# Patient Record
Sex: Male | Born: 1985 | Race: White | Hispanic: No | Marital: Married | Smoking: Never smoker
Health system: Southern US, Community
[De-identification: ages and names within clinical notes are randomized; demographics above are authoritative.]

## PROBLEM LIST (undated history)

## (undated) DIAGNOSIS — R112 Nausea with vomiting, unspecified: Secondary | ICD-10-CM

## (undated) DIAGNOSIS — T4145XA Adverse effect of unspecified anesthetic, initial encounter: Secondary | ICD-10-CM

## (undated) DIAGNOSIS — T8859XA Other complications of anesthesia, initial encounter: Secondary | ICD-10-CM

## (undated) DIAGNOSIS — Z9889 Other specified postprocedural states: Secondary | ICD-10-CM

## (undated) DIAGNOSIS — D649 Anemia, unspecified: Secondary | ICD-10-CM

## (undated) HISTORY — PX: TONSILLECTOMY: SUR1361

---

## 2005-10-03 ENCOUNTER — Emergency Department: Payer: Self-pay | Admitting: Unknown Physician Specialty

## 2006-10-12 ENCOUNTER — Emergency Department: Payer: Self-pay | Admitting: Internal Medicine

## 2006-10-17 ENCOUNTER — Emergency Department: Payer: Self-pay | Admitting: Emergency Medicine

## 2006-11-14 ENCOUNTER — Ambulatory Visit: Payer: Self-pay | Admitting: Family Medicine

## 2007-08-18 IMAGING — CR DG CLAVICLE*L*
1 series · 4 of 4 positions shown · non-contrast
Comparison: none

REASON FOR EXAM: Injury
COMMENTS:  LMP: (Male)

[Series 1: view not recorded · 0.17mm/px · 4 of 4 slices shown]
[im 1/4]
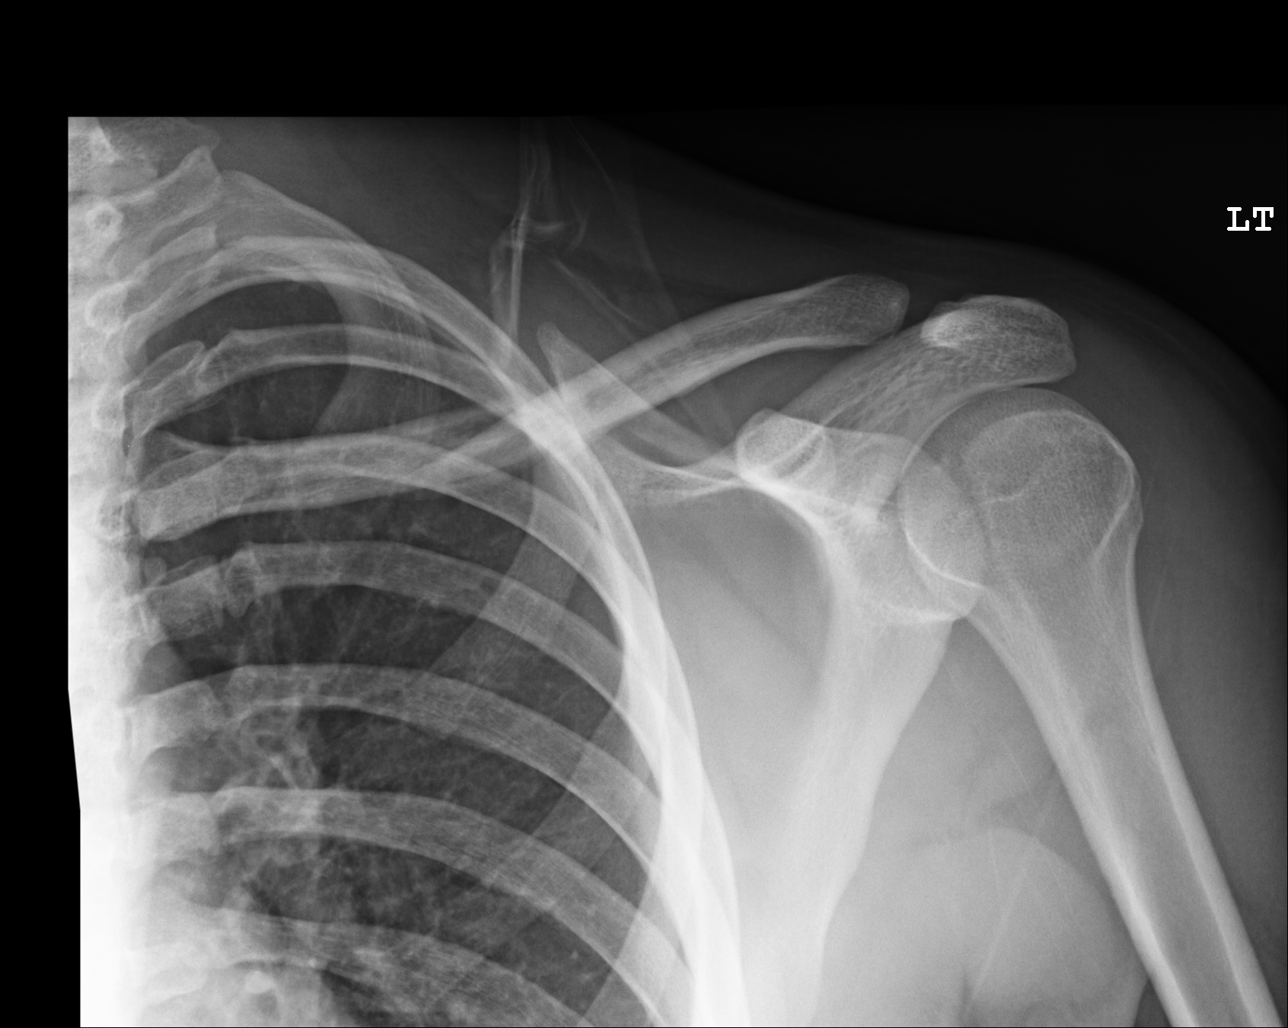
[im 2/4]
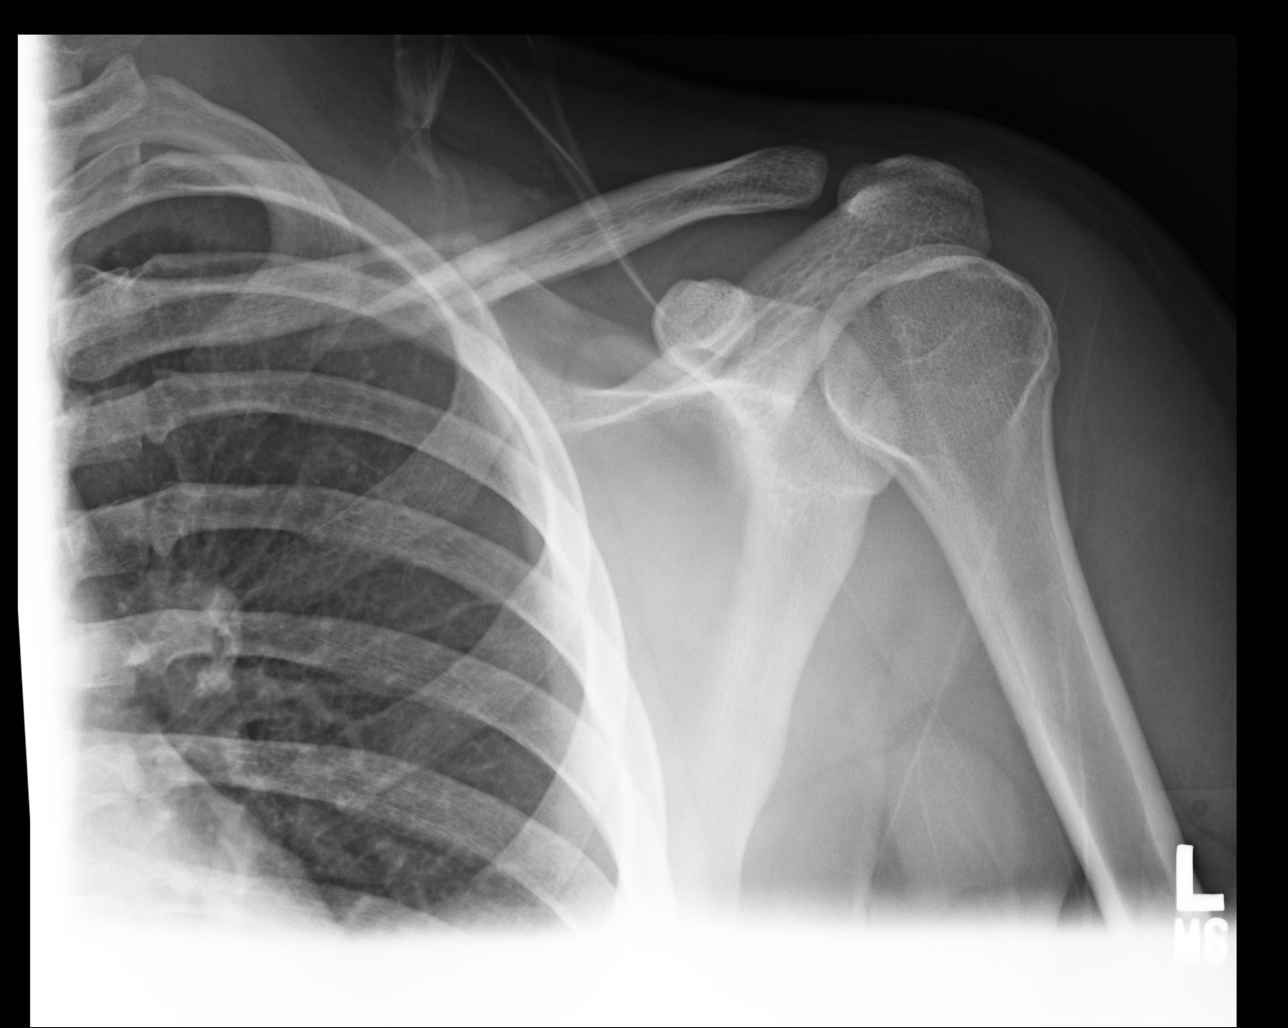
[im 3/4]
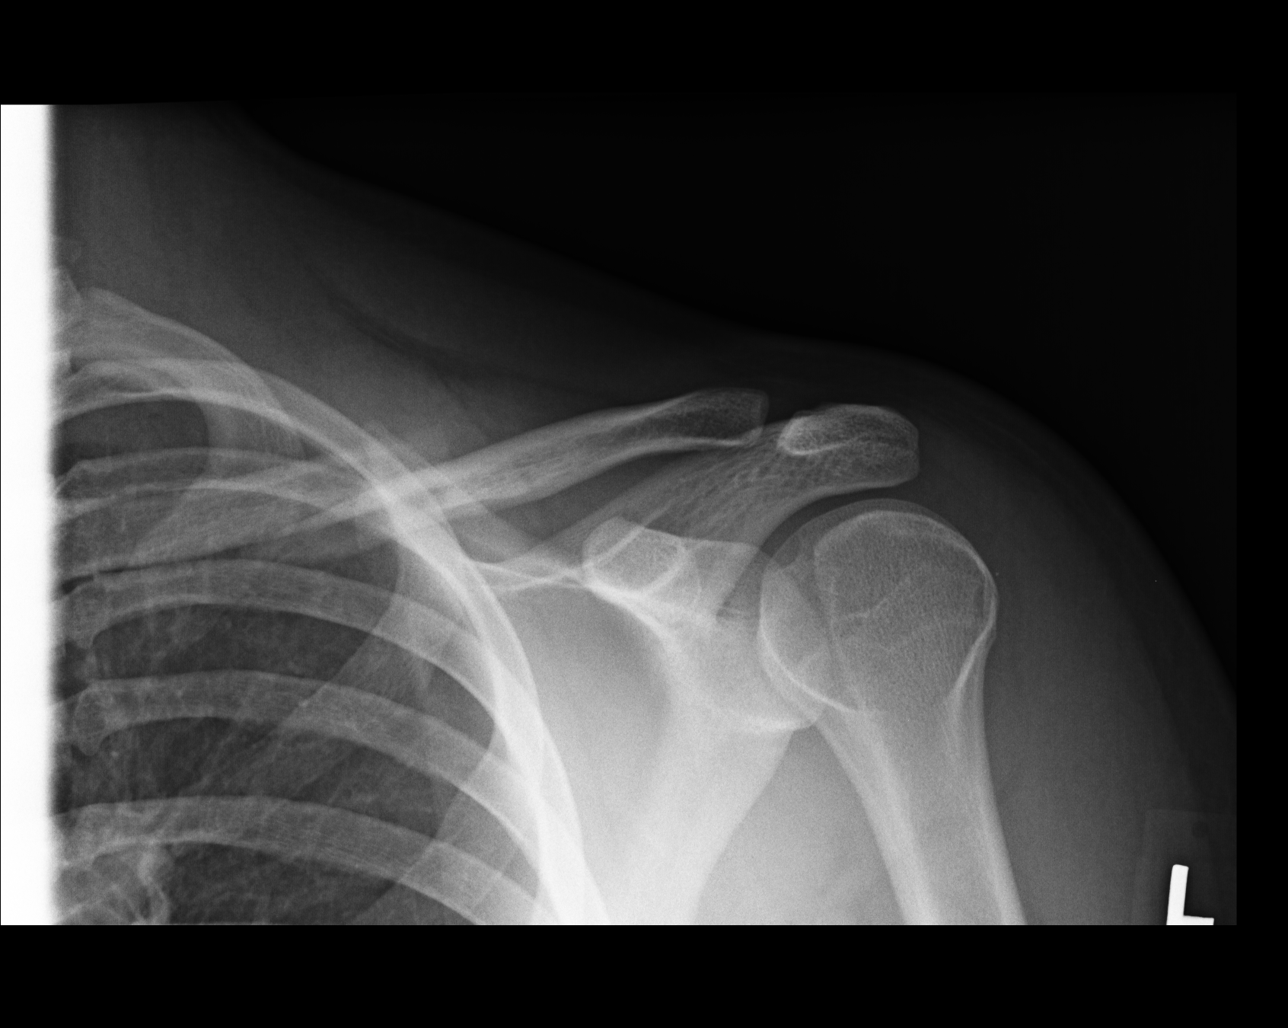
[im 4/4]
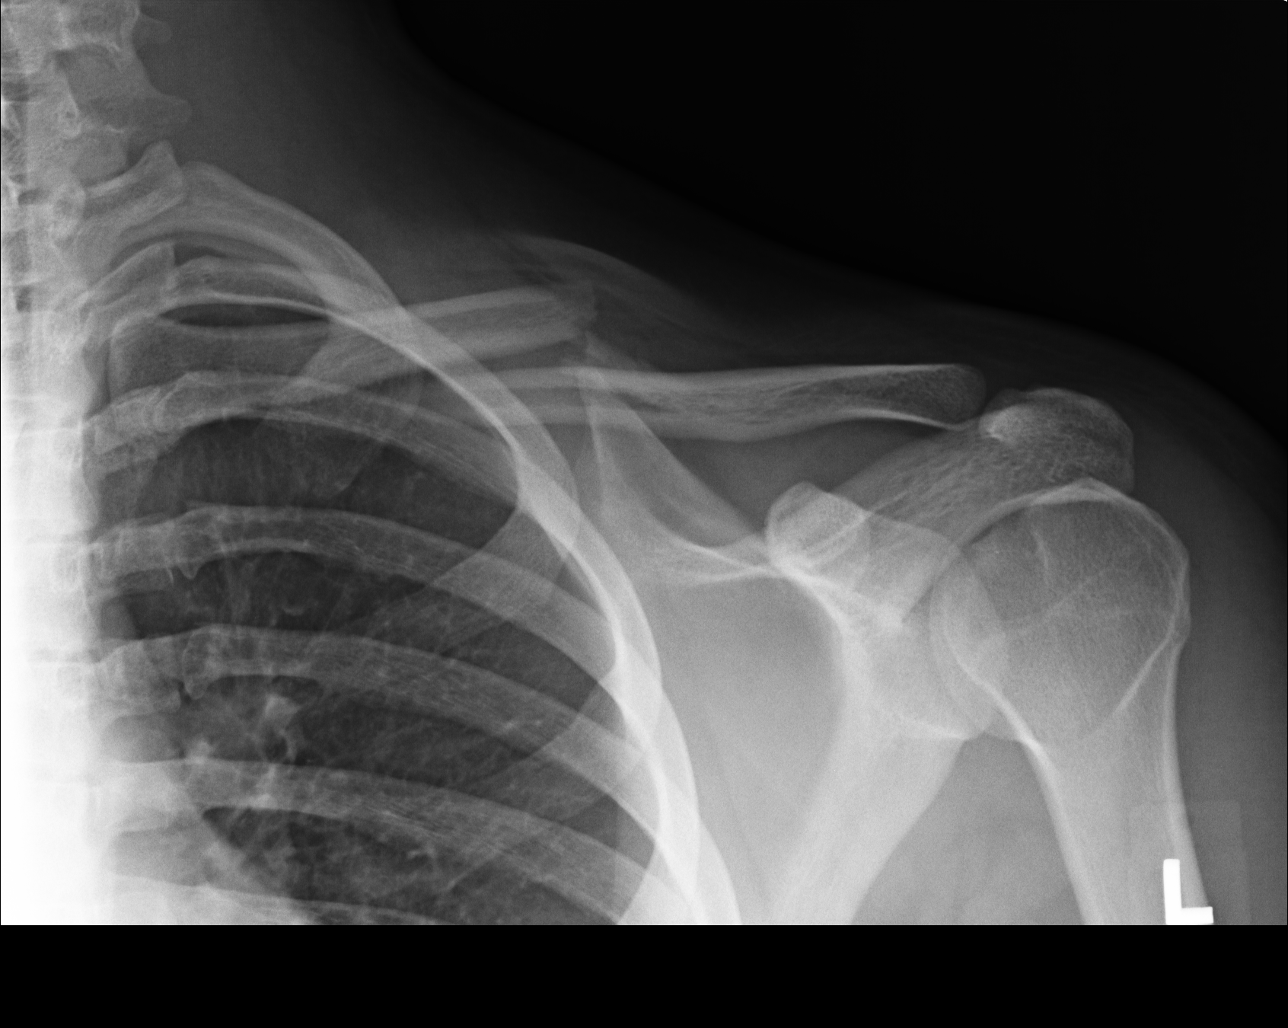

[4 of 4 positions shown; findings below may reference images not displayed]

PROCEDURE:     DXR - DXR CLAVICLE LEFT  - October 03, 2005  [DATE]

RESULT:     Four views of the LEFT clavicle were obtained. There is a
fracture of the midshaft of the clavicle with the lateral fracture component
displaced inferiorly by approximately 1.0 cm. No other fractures are seen.
No acromioclavicular separation is observed.
IMPRESSION: Fracture of the LEFT clavicle.
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
## 2007-11-13 ENCOUNTER — Ambulatory Visit: Payer: Self-pay | Admitting: Internal Medicine

## 2009-07-05 ENCOUNTER — Ambulatory Visit: Payer: Self-pay | Admitting: Family Medicine

## 2009-07-05 DIAGNOSIS — I10 Essential (primary) hypertension: Secondary | ICD-10-CM | POA: Insufficient documentation

## 2010-02-27 NOTE — Assessment & Plan Note (Signed)
Summary: TO BE EST/NJR   Vital Signs:  Patient profile:   25 year old male Height:      72.5 inches Weight:      225 pounds BMI:     30.20 Temp:     98.0 degrees F oral Pulse rate:   72 / minute Pulse rhythm:   regular Resp:     12 per minute BP sitting:   130 / 88  (left arm) Cuff size:   regular  Vitals Entered By: Sid Falcon LPN (July 06, 5782 9:08 AM)  Nutrition Counseling: Patient's BMI is greater than 25 and therefore counseled on weight management options.  CC: New to establish   History of Present Illness: Recent elev BP at work around 150s/90s  Pt saw company nurse and counseled with weight loss, exercise and diet change. has lost about 10 pounds past month with exercise and diet. BPs have improved. No ETOH use.  Nonsmoker  Preventive Screening-Counseling & Management  Alcohol-Tobacco     Smoking Status: never  Caffeine-Diet-Exercise     Does Patient Exercise: yes  Allergies (verified): 1)  Sulfa  Past History:  Family History: Last updated: 07/05/2009 Father elevated cholesterol Paternal grandfather, elevated cholesterol, hypertension Maternal grandfather, sudden death age 54, heart disease  Social History: Last updated: 07/05/2009 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 Never Smoked Alcohol use-no Regular exercise-yes  Risk Factors: Exercise: yes (07/05/2009)  Risk Factors: Smoking Status: never (07/05/2009)  Past Medical History: Hypertension  Past Surgical History:  Tonsillectomy, 1999  Family History: Father elevated cholesterol Paternal grandfather, elevated cholesterol, hypertension Maternal grandfather, sudden death age 63, heart disease  Social History: 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 Never Smoked Alcohol use-no Regular exercise-yes Occupation:  employed Smoking Status:  never Does Patient Exercise:  yes  Review of Systems  The patient denies anorexia, fever, weight loss, weight gain, vision loss,  decreased hearing, hoarseness, chest pain, syncope, dyspnea on exertion, peripheral edema, prolonged cough, headaches, hemoptysis, abdominal pain, melena, hematochezia, severe indigestion/heartburn, hematuria, incontinence, genital sores, muscle weakness, suspicious skin lesions, transient blindness, difficulty walking, depression, unusual weight change, abnormal bleeding, enlarged lymph nodes, and testicular masses.    Physical Exam  General:  Well-developed,well-nourished,in no acute distress; alert,appropriate and cooperative throughout examination Head:  Normocephalic and atraumatic without obvious abnormalities. No apparent alopecia or balding. Eyes:  pupils equal, pupils round, and pupils reactive to light.   Mouth:  Oral mucosa and oropharynx without lesions or exudates.  Teeth in good repair. Neck:  No deformities, masses, or tenderness noted. Lungs:  Normal respiratory effort, chest expands symmetrically. Lungs are clear to auscultation, no crackles or wheezes. Heart:  Normal rate and regular rhythm. S1 and S2 normal without gallop, murmur, click, rub or other extra sounds. Abdomen:  Bowel sounds positive,abdomen soft and non-tender without masses, organomegaly or hernias noted. Extremities:  no edema.   Impression & Recommendations:  Problem # 1:  HYPERTENSION (ICD-401.9) Assessment Improved cont healthy lifestyle behavior and close monitoring.  Complete Medication List: 1)  Nasonex 50 Mcg/act Susp (Mometasone furoate) .... Two sprays once daily each nostril  Patient Instructions: 1)  It is important that you exercise reguarly at least 20 minutes 5 times a week. If you develop chest pain, have severe difficulty breathing, or feel very tired, stop exercising immediately and seek medical attention.  2)  You need to lose weight. Consider a lower calorie diet and regular exercise.  3)  Check your  Blood Pressure regularly . If  it is above: 140/90  you should make an  appointment.  Preventive Care Screening  Last Tetanus Booster:    Date:  10/12/2006    Results:  Historical

## 2011-01-29 HISTORY — PX: COLONOSCOPY WITH ESOPHAGOGASTRODUODENOSCOPY (EGD): SHX5779

## 2011-02-12 ENCOUNTER — Ambulatory Visit: Payer: Self-pay | Admitting: Internal Medicine

## 2011-02-22 ENCOUNTER — Ambulatory Visit: Payer: Self-pay | Admitting: Internal Medicine

## 2011-02-26 ENCOUNTER — Ambulatory Visit: Payer: Self-pay | Admitting: Internal Medicine

## 2011-02-26 LAB — URINALYSIS, COMPLETE
Bacteria: NEGATIVE
Bilirubin,UR: NEGATIVE
Leukocyte Esterase: NEGATIVE
Nitrite: NEGATIVE
Ph: 7 (ref 4.5–8.0)
RBC,UR: NONE SEEN /HPF (ref 0–5)

## 2011-02-26 LAB — IRON: Iron: 22 ug/dL — ABNORMAL LOW (ref 65–175)

## 2011-03-01 ENCOUNTER — Ambulatory Visit: Payer: Self-pay | Admitting: Internal Medicine

## 2012-12-27 IMAGING — NM NUCLEAR MEDICINE MECKELS SCAN
2 series · 13 of 13 positions shown · non-contrast
Comparison: none

REASON FOR EXAM: iron def anemia
COMMENTS:

PROCEDURE:     NM  - NM NOBUTAKE LOCALIZATION SCAN  - [DATE] [DATE] [DATE]  [DATE]
RESULT:     Comparison: None.
Radiopharmaceutical: 18.35 mCi technetium 99m pertechnetate
TECHNIQUE: Standard departmental Jiky scan was performed, with
monitoring via anterior planar images for 60 minutes.

[Series 1000: (person_name) statics · 2.40mm/px · 7 of 7 slices shown]
[im 1/7]
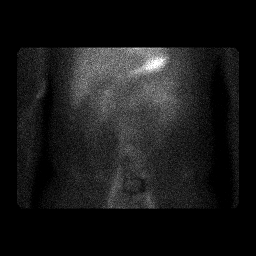
[im 2/7]
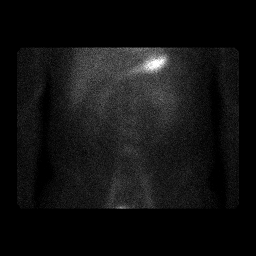
[im 3/7]
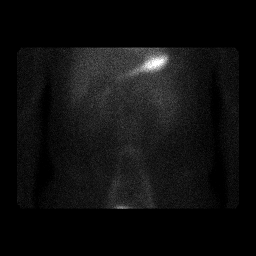
[im 4/7]
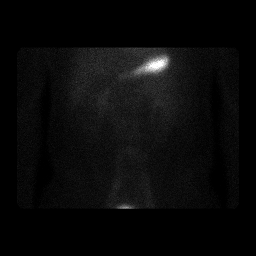
[im 5/7]
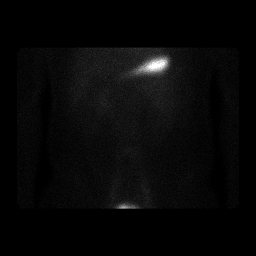
[im 6/7]
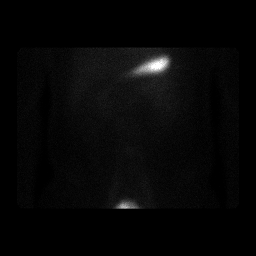
[im 7/7]
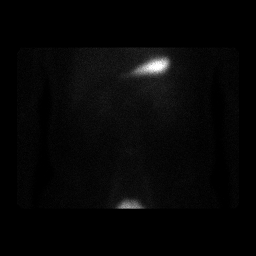

[Series 1000: (person_name) dynamic · 4.80mm/px · 6 of 12 frames shown]
[frame 2/12]
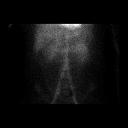
[frame 4/12]
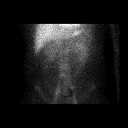
[frame 6/12]
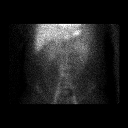
[frame 8/12]
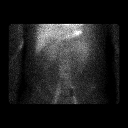
[frame 10/12]
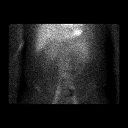
[frame 12/12]
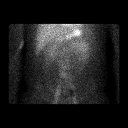

[13 of 13 positions shown; findings below may reference images not displayed]

FINDINGS: There is normal radiotracer distribution. Normal radiotracer activity is
seen within the stomach. There are no abnormal foci of radiotracer in the
mid to lower abdomen to suggest a Meckel's diverticulum.
IMPRESSION: No findings to suggest Meckel's diverticulum. Please note, some Meckel's
diverticulum did not contain ectopic gastric mucosa and would be occult on Fils-Aime
Jiky scan.

## 2015-05-29 ENCOUNTER — Encounter: Payer: Self-pay | Admitting: *Deleted

## 2015-05-29 ENCOUNTER — Other Ambulatory Visit: Payer: Self-pay

## 2015-05-29 HISTORY — PX: VASECTOMY: SHX75

## 2015-05-29 NOTE — Patient Instructions (Signed)
  Your procedure is scheduled on: 06-01-15 Report to [facility] 2ND FLOOR To find out your arrival time please call [phone] between 1PM - 3PM on 05-31-15  Remember: Instructions that are not followed completely may result in serious medical risk, up to and including death, or upon the discretion of your surgeon and anesthesiologist your surgery may need to be rescheduled.    _X___ 1. Do not eat food or drink liquids after midnight. No gum chewing or hard candies.     _X___ 2. No Alcohol for 24 hours before or after surgery.   ____ 3. Bring all medications with you on the day of surgery if instructed.    ____ 4. Notify your doctor if there is any change in your medical condition     (cold, fever, infections).     Do not wear jewelry, make-up, hairpins, clips or nail polish.  Do not wear lotions, powders, or perfumes. You may wear deodorant.  Do not shave 48 hours prior to surgery. Men may shave face and neck.  Do not bring valuables to the hospital.    [facility] is not responsible for any belongings or valuables.               Contacts, dentures or bridgework may not be worn into surgery.  Leave your suitcase in the car. After surgery it may be brought to your room.  For patients admitted to the hospital, discharge time is determined by your treatment team.   Patients discharged the day of surgery will not be allowed to drive home.   Please read over the following fact sheets that you were given:      ____ Take these medicines the morning of surgery with A SIP OF WATER:    1.NONE  2.   3.   4.  5.  6.  ____ Fleet Enema (as directed)   ____ Use CHG Soap as directed  ____ Use inhalers on the day of surgery  ____ Stop metformin 2 days prior to surgery    ____ Take 1/2 of usual insulin dose the night before surgery and none on the morning of surgery.   ____ Stop Coumadin/Plavix/aspirin-N/A  _X___ Stop Anti-inflammatories-STOP IBUPROFEN AFTER  TODAY-NO NSAIDS OR ASA PRODUCTS-TYLENOL OK TO TAKE   ____ Stop supplements until after surgery.    ____ Bring C-Pap to the hospital.

## 2015-05-31 ENCOUNTER — Encounter: Payer: Self-pay | Admitting: *Deleted

## 2015-06-01 ENCOUNTER — Ambulatory Visit
Admission: RE | Admit: 2015-06-01 | Discharge: 2015-06-01 | Disposition: A | Discharge status: Home / Self Care | Payer: BLUE CROSS/BLUE SHIELD | Source: Ambulatory Visit | Attending: Surgery | Admitting: Surgery

## 2015-06-01 ENCOUNTER — Ambulatory Visit: Payer: BLUE CROSS/BLUE SHIELD | Admitting: Certified Registered Nurse Anesthetist

## 2015-06-01 ENCOUNTER — Encounter: Payer: Self-pay | Admitting: *Deleted

## 2015-06-01 ENCOUNTER — Encounter
Admission: RE | Disposition: A | Discharge status: Home / Self Care | Payer: Self-pay | Source: Ambulatory Visit | Attending: Surgery

## 2015-06-01 DIAGNOSIS — Z9852 Vasectomy status: Secondary | ICD-10-CM | POA: Diagnosis not present

## 2015-06-01 DIAGNOSIS — Z882 Allergy status to sulfonamides status: Secondary | ICD-10-CM | POA: Diagnosis not present

## 2015-06-01 DIAGNOSIS — Z888 Allergy status to other drugs, medicaments and biological substances status: Secondary | ICD-10-CM | POA: Insufficient documentation

## 2015-06-01 DIAGNOSIS — Z87892 Personal history of anaphylaxis: Secondary | ICD-10-CM | POA: Diagnosis not present

## 2015-06-01 DIAGNOSIS — Y9367 Activity, basketball: Secondary | ICD-10-CM | POA: Insufficient documentation

## 2015-06-01 DIAGNOSIS — X58XXXA Exposure to other specified factors, initial encounter: Secondary | ICD-10-CM | POA: Insufficient documentation

## 2015-06-01 DIAGNOSIS — Z9889 Other specified postprocedural states: Secondary | ICD-10-CM | POA: Insufficient documentation

## 2015-06-01 DIAGNOSIS — S86011A Strain of right Achilles tendon, initial encounter: Secondary | ICD-10-CM | POA: Diagnosis present

## 2015-06-01 DIAGNOSIS — Z8349 Family history of other endocrine, nutritional and metabolic diseases: Secondary | ICD-10-CM | POA: Diagnosis not present

## 2015-06-01 HISTORY — DX: Adverse effect of unspecified anesthetic, initial encounter: T41.45XA

## 2015-06-01 HISTORY — DX: Other specified postprocedural states: R11.2

## 2015-06-01 HISTORY — DX: Other complications of anesthesia, initial encounter: T88.59XA

## 2015-06-01 HISTORY — DX: Anemia, unspecified: D64.9

## 2015-06-01 HISTORY — DX: Other specified postprocedural states: Z98.890

## 2015-06-01 HISTORY — PX: ACHILLES TENDON SURGERY: SHX542

## 2015-06-01 SURGERY — REPAIR, TENDON, ACHILLES
Anesthesia: General | Laterality: Right

## 2015-06-01 MED ORDER — MIDAZOLAM HCL 2 MG/2ML IJ SOLN
INTRAMUSCULAR | Status: DC | PRN
  Administered 2015-06-01: 2 mg via INTRAVENOUS

## 2015-06-01 MED ORDER — KETOROLAC TROMETHAMINE 30 MG/ML IJ SOLN
INTRAMUSCULAR | Status: DC | PRN
  Administered 2015-06-01: 30 mg via INTRAVENOUS

## 2015-06-01 MED ORDER — ONDANSETRON HCL 4 MG/2ML IJ SOLN: 4.0000 mg | Freq: Four times a day (QID) | INTRAMUSCULAR | Status: DC | PRN

## 2015-06-01 MED ORDER — NEOSTIGMINE METHYLSULFATE 10 MG/10ML IV SOLN
INTRAVENOUS | Status: DC | PRN
  Administered 2015-06-01: 4 mg via INTRAVENOUS

## 2015-06-01 MED ORDER — HYDROCODONE-ACETAMINOPHEN 5-325 MG PO TABS: 1.0000 | ORAL_TABLET | Freq: Four times a day (QID) | ORAL | Status: DC | PRN

## 2015-06-01 MED ORDER — LIDOCAINE HCL (CARDIAC) 20 MG/ML IV SOLN
INTRAVENOUS | Status: DC | PRN
  Administered 2015-06-01: 100 mg via INTRAVENOUS

## 2015-06-01 MED ORDER — FAMOTIDINE 20 MG PO TABS
20.0000 mg | ORAL_TABLET | Freq: Once | ORAL | Status: AC
  Administered 2015-06-01: 20 mg via ORAL

## 2015-06-01 MED ORDER — CEFAZOLIN SODIUM-DEXTROSE 2-4 GM/100ML-% IV SOLN
2.0000 g | Freq: Once | INTRAVENOUS | Status: AC
  Administered 2015-06-01: 2 g via INTRAVENOUS

## 2015-06-01 MED ORDER — METOCLOPRAMIDE HCL 10 MG PO TABS: 5.0000 mg | ORAL_TABLET | Freq: Three times a day (TID) | ORAL | Status: DC | PRN

## 2015-06-01 MED ORDER — NEOMYCIN-POLYMYXIN B GU 40-200000 IR SOLN
Status: DC | PRN
  Administered 2015-06-01: 4 mL

## 2015-06-01 MED ORDER — ONDANSETRON HCL 4 MG PO TABS: 4.0000 mg | ORAL_TABLET | Freq: Four times a day (QID) | ORAL | Status: DC | PRN

## 2015-06-01 MED ORDER — NALOXONE HCL 0.4 MG/ML IJ SOLN
INTRAMUSCULAR | Status: DC | PRN
  Administered 2015-06-01 (×2): 80 ug via INTRAVENOUS

## 2015-06-01 MED ORDER — CEFAZOLIN SODIUM-DEXTROSE 2-4 GM/100ML-% IV SOLN
INTRAVENOUS | Status: AC
  Filled 2015-06-01: qty 100

## 2015-06-01 MED ORDER — BUPIVACAINE HCL (PF) 0.5 % IJ SOLN
INTRAMUSCULAR | Status: AC
  Filled 2015-06-01: qty 30

## 2015-06-01 MED ORDER — FENTANYL CITRATE (PF) 100 MCG/2ML IJ SOLN
INTRAMUSCULAR | Status: DC | PRN
  Administered 2015-06-01 (×2): 250 ug via INTRAVENOUS

## 2015-06-01 MED ORDER — NEOMYCIN-POLYMYXIN B GU 40-200000 IR SOLN
Status: AC
Start: 2015-06-01 — End: 2015-06-01
  Filled 2015-06-01: qty 4

## 2015-06-01 MED ORDER — BUPIVACAINE HCL 0.5 % IJ SOLN
INTRAMUSCULAR | Status: DC | PRN
  Administered 2015-06-01: 10 mL

## 2015-06-01 MED ORDER — ROCURONIUM BROMIDE 100 MG/10ML IV SOLN
INTRAVENOUS | Status: DC | PRN
  Administered 2015-06-01: 10 mg via INTRAVENOUS
  Administered 2015-06-01: 50 mg via INTRAVENOUS

## 2015-06-01 MED ORDER — KETAMINE HCL 50 MG/ML IJ SOLN
INTRAMUSCULAR | Status: DC | PRN
  Administered 2015-06-01: 50 mg via INTRAVENOUS

## 2015-06-01 MED ORDER — GLYCOPYRROLATE 0.2 MG/ML IJ SOLN
INTRAMUSCULAR | Status: DC | PRN
  Administered 2015-06-01: .5 mg via INTRAVENOUS

## 2015-06-01 MED ORDER — ONDANSETRON HCL 4 MG/2ML IJ SOLN: 4.0000 mg | Freq: Once | INTRAMUSCULAR | Status: DC | PRN

## 2015-06-01 MED ORDER — FENTANYL CITRATE (PF) 100 MCG/2ML IJ SOLN: 25.0000 ug | INTRAMUSCULAR | Status: DC | PRN

## 2015-06-01 MED ORDER — PROPOFOL 10 MG/ML IV BOLUS
INTRAVENOUS | Status: DC | PRN
  Administered 2015-06-01: 200 mg via INTRAVENOUS

## 2015-06-01 MED ORDER — POTASSIUM CHLORIDE IN NACL 20-0.9 MEQ/L-% IV SOLN: INTRAVENOUS | Status: DC

## 2015-06-01 MED ORDER — LACTATED RINGERS IV SOLN
INTRAVENOUS | Status: DC
  Administered 2015-06-01: 50 mL/h via INTRAVENOUS

## 2015-06-01 MED ORDER — METOCLOPRAMIDE HCL 5 MG/ML IJ SOLN: 5.0000 mg | Freq: Three times a day (TID) | INTRAMUSCULAR | Status: DC | PRN

## 2015-06-01 MED ORDER — FAMOTIDINE 20 MG PO TABS
ORAL_TABLET | ORAL | Status: AC
  Administered 2015-06-01: 20 mg via ORAL
  Filled 2015-06-01: qty 1

## 2015-06-01 MED ORDER — ONDANSETRON HCL 4 MG/2ML IJ SOLN
INTRAMUSCULAR | Status: DC | PRN
  Administered 2015-06-01: 4 mg via INTRAVENOUS

## 2015-06-01 MED ORDER — HYDROCODONE-ACETAMINOPHEN 5-325 MG PO TABS: 1.0000 | ORAL_TABLET | ORAL | Status: DC | PRN

## 2015-06-01 SURGICAL SUPPLY — 38 items
BANDAGE ACE 4X5 VEL STRL LF (GAUZE/BANDAGES/DRESSINGS) ×6 IMPLANT
BNDG ESMARK 4X12 TAN STRL LF (GAUZE/BANDAGES/DRESSINGS) ×3 IMPLANT
CANISTER SUCT 1200ML W/VALVE (MISCELLANEOUS) ×3 IMPLANT
CASTING MATERIAL DELTA LITE (CAST SUPPLIES) ×3 IMPLANT
CHLORAPREP W/TINT 26ML (MISCELLANEOUS) ×6 IMPLANT
ELECT CAUTERY BLADE 6.4 (BLADE) ×3 IMPLANT
ELECT REM PT RETURN 9FT ADLT (ELECTROSURGICAL) ×3
ELECTRODE REM PT RTRN 9FT ADLT (ELECTROSURGICAL) ×1 IMPLANT
GAUZE PETRO XEROFOAM 1X8 (MISCELLANEOUS) ×3 IMPLANT
GAUZE SPONGE 4X4 12PLY STRL (GAUZE/BANDAGES/DRESSINGS) ×3 IMPLANT
GLOVE BIO SURGEON STRL SZ8 (GLOVE) ×6 IMPLANT
GLOVE INDICATOR 8.0 STRL GRN (GLOVE) ×3 IMPLANT
GOWN STRL REUS W/ TWL LRG LVL3 (GOWN DISPOSABLE) ×1 IMPLANT
GOWN STRL REUS W/ TWL XL LVL3 (GOWN DISPOSABLE) ×1 IMPLANT
GOWN STRL REUS W/TWL LRG LVL3 (GOWN DISPOSABLE) ×2
GOWN STRL REUS W/TWL XL LVL3 (GOWN DISPOSABLE) ×2
NS IRRIG 1000ML POUR BTL (IV SOLUTION) ×3 IMPLANT
PACK EXTREMITY ARMC (MISCELLANEOUS) ×3 IMPLANT
PAD ABD DERMACEA PRESS 5X9 (GAUZE/BANDAGES/DRESSINGS) ×6 IMPLANT
PAD CAST CTTN 4X4 STRL (SOFTGOODS) ×3 IMPLANT
PADDING CAST COTTON 4X4 STRL (SOFTGOODS) ×6
SPLINT CAST 1 STEP 5X30 WHT (MISCELLANEOUS) ×3 IMPLANT
SPONGE LAP 18X18 5 PK (GAUZE/BANDAGES/DRESSINGS) ×3 IMPLANT
SPONGE XRAY 4X4 16PLY STRL (MISCELLANEOUS) ×3 IMPLANT
STAPLER SKIN PROX 35W (STAPLE) ×3 IMPLANT
STOCKINETTE IMPERV 14X48 (MISCELLANEOUS) ×3 IMPLANT
STOCKINETTE IMPERVIOUS 9X36 MD (GAUZE/BANDAGES/DRESSINGS) ×3 IMPLANT
STRAP SAFETY BODY (MISCELLANEOUS) ×3 IMPLANT
SUT ETHIBOND 5-0 MS/4 CCS GRN (SUTURE) ×3
SUT FIBERWIRE #5 38 CONV BLUE (SUTURE) ×6
SUT PROLENE 4 0 PS 2 18 (SUTURE) ×6 IMPLANT
SUT TICRON 2-0 30IN 311381 (SUTURE) ×3 IMPLANT
SUT VIC AB 0 CT1 36 (SUTURE) ×3 IMPLANT
SUT VIC AB 1 CT1 36 (SUTURE) ×3 IMPLANT
SUT VIC AB 2-0 CT1 27 (SUTURE) ×2
SUT VIC AB 2-0 CT1 TAPERPNT 27 (SUTURE) ×1 IMPLANT
SUTURE ETHBND 5-0 MS/4 CCS GRN (SUTURE) ×1 IMPLANT
SUTURE FIBERWR #5 38 CONV BLUE (SUTURE) ×2 IMPLANT

## 2015-06-01 NOTE — Anesthesia Postprocedure Evaluation (Signed)
Anesthesia Post Note  Patient: Bryce Tran  Procedure(s) Performed: Procedure(s) (LRB): ACHILLES TENDON REPAIR (Right)  Patient location during evaluation: PACU Anesthesia Type: General Level of consciousness: awake and alert Pain management: pain level controlled Vital Signs Assessment: post-procedure vital signs reviewed and stable Respiratory status: spontaneous breathing and respiratory function stable Cardiovascular status: stable Anesthetic complications: no    Last Vitals:  Filed Vitals:   06/01/15 1029 06/01/15 1100  BP: 161/88 147/74  Pulse: 82 86  Temp: 36.6 C   Resp: 14     Last Pain:  Filed Vitals:   06/01/15 1112  PainSc: 0-No pain                 KEPHART,WILLIAM K

## 2015-06-01 NOTE — Transfer of Care (Signed)
Immediate Anesthesia Transfer of Care Note  Patient: Bryce Tran  Procedure(s) Performed: Procedure(s): ACHILLES TENDON REPAIR (Right)  Patient Location: PACU  Anesthesia Type:General  Level of Consciousness: awake, alert , oriented and patient cooperative  Airway & Oxygen Therapy: Patient Spontanous Breathing and Patient connected to face mask oxygen  Post-op Assessment: Report given to RN and Post -op Vital signs reviewed and stable  Post vital signs: Reviewed and stable  Last Vitals:  Filed Vitals:   06/01/15 0619 06/01/15 0927  BP: 120/73 170/95  Pulse: 54   Temp: 35.6 C 36 C  Resp: 1     Last Pain:  Filed Vitals:   06/01/15 0928  PainSc: 1       Patients Stated Pain Goal: 0 (06/01/15 [id number])  Complications: No apparent anesthesia complications

## 2015-06-01 NOTE — Discharge Instructions (Addendum)
Keep splint dry and intact.  Keep leg elevated above heart level. Apply ice frequently to ankle. No weight-bearing on right foot - use crutches. Follow-up in 10-14 days or as scheduled.    AMBULATORY SURGERY  DISCHARGE INSTRUCTIONS   1) The drugs that you were given will stay in your system until tomorrow so for the next 24 hours you should not:  A) Drive an automobile B) Make any legal decisions C) Drink any alcoholic beverage   2) You may resume regular meals tomorrow.  Today it is better to start with liquids and gradually work up to solid foods.  You may eat anything you prefer, but it is better to start with liquids, then soup and crackers, and gradually work up to solid foods.   3) Please notify your doctor immediately if you have any unusual bleeding, trouble breathing, redness and pain at the surgery site, drainage, fever, or pain not relieved by medication. 4)   5) Your post-operative visit with Dr.                                     is: Date:                        Time:    Please call to schedule your post-operative visit.  6) Additional Instructions: 7)

## 2015-06-01 NOTE — Anesthesia Procedure Notes (Signed)
Procedure Name: Intubation Date/Time: 06/01/2015 7:45 AM Performed by: Shirlee LimerickMARION, Bryce Dunsworth Pre-anesthesia Checklist: Patient identified, Emergency Drugs available, Suction available and Patient being monitored Patient Re-evaluated:Patient Re-evaluated prior to inductionOxygen Delivery Method: Circle system utilized Preoxygenation: Pre-oxygenation with 100% oxygen Intubation Type: IV induction Laryngoscope Size: Mac and 3 Grade View: Grade I Tube type: Oral Tube size: 7.0 mm Number of attempts: 1 Placement Confirmation: ETT inserted through vocal cords under direct vision,  positive ETCO2 and breath sounds checked- equal and bilateral Secured at: 22 cm Tube secured with: Tape Dental Injury: Teeth and Oropharynx as per pre-operative assessment

## 2015-06-01 NOTE — Op Note (Signed)
06/01/2015  9:18 AM  Patient:   Bryce BassBenjamin Dunn  Pre-Op Diagnosis:   Acute right Achilles tendon rupture.  Post-Op Diagnosis:   Same.  Procedure:   Primary repair of acute right Achilles tendon rupture.  Surgeon:   Maryagnes AmosJ. Jeffrey Poggi, MD  Assistant:   None  Anesthesia:   GET  Findings:   As above.  Complications:   None  EBL:   2 cc  Fluids:   900 cc crystalloid  TT:   60 min. at 300 mmHg  Drains:   None  Closure:   4-0 Prolene interrupted sutures  Brief Clinical Note:   The patient is a 30 year old male who sustained the above-noted injury approximately 1 week ago when he felt a pop in his ankle while playing basketball. He presented to the office where examination confirmed the presence of an acute complete rupture of the Achilles tendon. He presents at this time for a primary repair of the right Achilles tendon rupture.  Procedure:   The patient was brought into the operating room and lain in the supine position. After adequate general endotracheal intubation and anesthesia were obtained, the patient was rolled into the prone position on the operating table. Care was taken to be sure the chest pads were well positioned and that his knees were well padded. The right lower extremity and foot was prepped with ChloraPrep solution before being draped sterilely. Preoperative antibiotics were administered. A timeout was performed to verify the appropriate surgical site before an approximately 6-7 cm incision was made along the posterior medial aspect of the Achilles tendon centered over the palpated defect. The incision was carried down through the subcutaneous tissues to expose the tendon sheath. This was incised the length of the incision to expose the tendon rupture site. The frayed margins of the proximal and distal ends of the ruptured tendon were debrided sharply. A #5 FiberWire was woven through the proximal tendon stump in a Bunnell type fashion. A second #5 FiberWire was woven  through the distal tendon stump similarly. A strong pull was applied to each suture to be sure that they were well seated and secure. With the ankle plantar flexed to approximately 20, the sutures were tied together to reapproximate the Achilles tendon. A 4-0 Prolene suture was placed in a baseball stitch type fashion across the medial, posterior, and lateral portions of the repaired tendon to reapproximate the margins of the rupture site. The tendon repair appeared stable to 10 of plantar flexion.  The wound was copiously irrigated with sterile saline solution before the tendon sheath was carefully reapproximated using 2-0 Vicryl interrupted sutures. The subcutaneous tissues were closed using 3-0 Vicryl interrupted sutures before the skin was closed using 4-0 Prolene interrupted sutures. A total of 10 cc of 0.5% plain Sensorcaine was injected in and around the incision site to help with postoperative analgesia before a sterile bulky dressing was applied to the wound. The patient was then placed into a posterior splint with a sugar tong subsequent, maintaining the ankle at approximately 10 of plantar flexion. The patient was rolled back into the supine position on his hospital bed before he was awakened, extubated, and returned to the recovery room in satisfactory condition after tolerating the procedure well.

## 2015-06-01 NOTE — H&P (Signed)
Paper H&P to be scanned into permanent record. H&P reviewed. No changes. 

## 2015-06-01 NOTE — Anesthesia Preprocedure Evaluation (Signed)
Anesthesia Evaluation  Patient identified by MRN, date of birth, ID band Patient awake    Reviewed: Allergy & Precautions, NPO status , Patient's Chart, lab work & pertinent test results  History of Anesthesia Complications (+) PONV  Airway Mallampati: I       Dental   Pulmonary neg pulmonary ROS,           Cardiovascular negative cardio ROS       Neuro/Psych negative neurological ROS     GI/Hepatic negative GI ROS, Neg liver ROS,   Endo/Other  negative endocrine ROS  Renal/GU negative Renal ROS     Musculoskeletal   Abdominal   Peds  Hematology  (+) anemia ,   Anesthesia Other Findings   Reproductive/Obstetrics                             Anesthesia Physical Anesthesia Plan  ASA: II  Anesthesia Plan: General   Post-op Pain Management:    Induction: Intravenous  Airway Management Planned: LMA  Additional Equipment:   Intra-op Plan:   Post-operative Plan:   Informed Consent: I have reviewed the patients History and Physical, chart, labs and discussed the procedure including the risks, benefits and alternatives for the proposed anesthesia with the patient or authorized representative who has indicated his/her understanding and acceptance.     Plan Discussed with:   Anesthesia Plan Comments:         Anesthesia Quick Evaluation

## 2015-06-17 ENCOUNTER — Encounter: Payer: Self-pay | Admitting: Emergency Medicine

## 2015-06-17 ENCOUNTER — Emergency Department
Admission: EM | Admit: 2015-06-17 | Discharge: 2015-06-17 | Disposition: A | Discharge status: Home / Self Care | Payer: BLUE CROSS/BLUE SHIELD | Attending: Emergency Medicine | Admitting: Emergency Medicine

## 2015-06-17 DIAGNOSIS — Z7689 Persons encountering health services in other specified circumstances: Secondary | ICD-10-CM

## 2015-06-17 DIAGNOSIS — H6001 Abscess of right external ear: Secondary | ICD-10-CM | POA: Insufficient documentation

## 2015-06-17 DIAGNOSIS — I10 Essential (primary) hypertension: Secondary | ICD-10-CM | POA: Insufficient documentation

## 2015-06-17 DIAGNOSIS — H6011 Cellulitis of right external ear: Secondary | ICD-10-CM

## 2015-06-17 DIAGNOSIS — Z0189 Encounter for other specified special examinations: Secondary | ICD-10-CM

## 2015-06-17 MED ORDER — CLINDAMYCIN HCL 300 MG PO CAPS: 300.0000 mg | ORAL_CAPSULE | Freq: Four times a day (QID) | ORAL | Status: DC

## 2015-06-17 MED ORDER — CLINDAMYCIN HCL 150 MG PO CAPS
300.0000 mg | ORAL_CAPSULE | Freq: Once | ORAL | Status: AC
  Administered 2015-06-17: 300 mg via ORAL

## 2015-06-17 MED ORDER — CLINDAMYCIN HCL 150 MG PO CAPS
ORAL_CAPSULE | ORAL | Status: AC
  Filled 2015-06-17: qty 2

## 2015-06-17 MED ORDER — LIDOCAINE HCL (PF) 1 % IJ SOLN
INTRAMUSCULAR | Status: AC
  Administered 2015-06-17: 5 mL
  Filled 2015-06-17: qty 5

## 2015-06-17 MED ORDER — LIDOCAINE HCL (PF) 1 % IJ SOLN
5.0000 mL | Freq: Once | INTRAMUSCULAR | Status: AC
  Administered 2015-06-17: 5 mL

## 2015-06-17 MED ORDER — BACITRACIN ZINC 500 UNIT/GM EX OINT
TOPICAL_OINTMENT | Freq: Every day | CUTANEOUS | Status: DC
  Administered 2015-06-17: 16:00:00 via TOPICAL
  Filled 2015-06-17 (×2): qty 0.9

## 2015-06-17 NOTE — ED Notes (Signed)
Pt was seen at urgent care with cellulitis on RT ear and given doxycycline and pain meds. Pt states its more inflamed and spread. Pt in NAD at this time Pt has done warm compress at home with no weeping.

## 2015-06-17 NOTE — ED Notes (Signed)
Diagnosed with cellulitis R ear 2 days ago, started by doxycycline by urgent care. More inflamed today.

## 2015-06-17 NOTE — ED Provider Notes (Signed)
[facility] Emergency Department Provider Note ____________________________________________  Time seen: 1415  I have reviewed the triage vital signs and the nursing notes.  HISTORY  Chief Complaint  Cellulitis   HPI Bryce Tran is a 30 y.o. male presents to the ED for evaluation of cellulitis to the right ear with onset of facial swelling upon awakening this morning. The patient describes a chronic sinus tract to the right earlobe that at times will get inflamed. He was evaluated 2 days prior by a local urgent care and placed on doxycycline. He describes take the medicine as directed until he woke up today noting swelling that had increased in the right earlobe as well as progression of swelling to the face and around the right eye. He denies any interim fevers, chills, sweats. He has been taking ibuprofen on schedule since the onset. He noted a temperature 99.5 degrees today. He presents with increased redness, swelling, and fluctuance to the earlobe on the right.He reports his pain at 1/10 in triage.   Past Medical History  Diagnosis Date  . Complication of anesthesia   . PONV (postoperative nausea and vomiting)     PT VOMITED X1 AFTER COLONOSCOPY/EGD  . Anemia     IRON DEFICIENCY-LAST RECEIVED IRON INFUSION ON 04-2015-LAST LABS 04-2015 HGB 13.8    Patient Active Problem List   Diagnosis Date Noted  . HYPERTENSION 07/05/2009    Past Surgical History  Procedure Laterality Date  . Vasectomy  05-2015  . Tonsillectomy    . Colonoscopy with esophagogastroduodenoscopy (egd)  2013  . Achilles tendon surgery Right 06/01/2015    Procedure: ACHILLES TENDON REPAIR;  Surgeon: Christena Flake, MD;  Location: [facility] ORS;  Service: Orthopedics;  Laterality: Right;    Current Outpatient Rx  Name  Route  Sig  Dispense  Refill  . clindamycin (CLEOCIN) 300 MG capsule   Oral   Take 1 capsule (300 mg total) by mouth 4 (four) times daily.   40 capsule   0   .  HYDROcodone-acetaminophen (NORCO) 5-325 MG tablet   Oral   Take 1-2 tablets by mouth every 6 (six) hours as needed for moderate pain. MAXIMUM TOTAL ACETAMINOPHEN DOSE IS 4000 MG PER DAY   40 tablet   0   . ibuprofen (ADVIL,MOTRIN) 800 MG tablet   Oral   Take 800 mg by mouth every 8 (eight) hours.         . Multiple Vitamin (MULTIVITAMIN) tablet   Oral   Take 1 tablet by mouth daily.          Allergies Iron dextran and Sulfonamide derivatives  No family history on file.  Social History Social History  Substance Use Topics  . Smoking status: Never Smoker   . Smokeless tobacco: None     Comment: no passive smokers in home  . Alcohol Use: No    Review of Systems  Constitutional: Negative for fever. Eyes: Negative for visual changes. ENT: Negative for sore throat. Right earlobe pain and swelling.  Skin: Negative for rash. Neurological: Negative for headaches, focal weakness or numbness. ____________________________________________  PHYSICAL EXAM:  VITAL SIGNS: ED Triage Vitals  Enc Vitals Group     BP 06/17/15 1335 131/70 mmHg     Pulse Rate 06/17/15 1335 86     Resp 06/17/15 1335 20     Temp 06/17/15 1334 98.1 F (36.7 C)     Temp Source 06/17/15 1334 Oral     SpO2 06/17/15 1335 99 %  Weight 06/17/15 1335 238 lb (107.956 kg)     Height 06/17/15 1335 6\' 1"  (1.854 m)     Head Cir --      Peak Flow --      Pain Score 06/17/15 1336 1     Pain Loc --      Pain Edu? --      Excl. in GC? --    Constitutional: Alert and oriented. Well appearing and in no distress. Head: Normocephalic and atraumatic.      Eyes: Conjunctivae are normal. PERRL. Normal extraocular movements      Ears: Canals clear. TMs intact bilaterally. Right earlobe with local erythema, edema, and fluctuance to the crura of the antihelix.    Nose: No congestion/rhinorrhea.   Mouth/Throat: Mucous membranes are moist.   Neck: Supple. No  thyromegaly. Hematological/Lymphatic/Immunological: No cervical or auricular lymphadenopathy. Respiratory: Normal respiratory effort.  Musculoskeletal: Nontender with normal range of motion in all extremities.  Neurologic:  Normal gait without ataxia. Normal speech and language. No gross focal neurologic deficits are appreciated. Skin:  Skin is warm, dry and intact. No rash noted. ____________________________________________  PROCEDURES  Clindamycin 300 mg PO  INCISION AND DRAINAGE Performed by: Lissa HoardMenshew, Teriyah Purington V Bacon Consent: Verbal consent obtained. Risks and benefits: risks, benefits and alternatives were discussed Type: abscess  Body area: right ear  Anesthesia: local infiltration  Incision was made with a scalpel.  Local anesthetic: lidocaine 1% w/o epinephrine  Anesthetic total: 2 ml  Complexity: complex Blunt dissection to break up loculations  Drainage: purulent  Drainage amount: moderate  Packing material: 1/4 in iodoform gauze  Patient tolerance: Patient tolerated the procedure well with no immediate complications. ____________________________________________  INITIAL IMPRESSION / ASSESSMENT AND PLAN / ED COURSE  Patient with a cellulitis and local abscess the the right earlobe s/p I&D. Patient will be discharged with a prescription for clindamycin to dose in lieu of the previously prescribed doxycycline. He will follow-up with his primary care provider for further wound management.  ____________________________________________  FINAL CLINICAL IMPRESSION(S) / ED DIAGNOSES  Final diagnoses:  Cellulitis of earlobe, right  Abscess of earlobe, right  Encounter for incision and drainage procedure     Lissa HoardJenise V Bacon Albina Gosney, PA-C 06/17/15 1641  Emily FilbertJonathan E Williams, MD 06/18/15 1623

## 2015-06-17 NOTE — Discharge Instructions (Signed)
Abscess An abscess (boil or furuncle) is an infected area on or under the skin. This area is filled with yellowish-white fluid (pus) and other material (debris). HOME CARE   Only take medicines as told by your doctor.  If you were given antibiotic medicine, take it as directed. Finish the medicine even if you start to feel better.  If gauze is used, follow your doctor's directions for changing the gauze.  To avoid spreading the infection:  Keep your abscess covered with a bandage.  Wash your hands well.  Do not share personal care items, towels, or whirlpools with others.  Avoid skin contact with others.  Keep your skin and clothes clean around the abscess.  Keep all doctor visits as told. GET HELP RIGHT AWAY IF:   You have more pain, puffiness (swelling), or redness in the wound site.  You have more fluid or blood coming from the wound site.  You have muscle aches, chills, or you feel sick.  You have a fever. MAKE SURE YOU:   Understand these instructions.  Will watch your condition.  Will get help right away if you are not doing well or get worse.   This information is not intended to replace advice given to you by your health care provider. Make sure you discuss any questions you have with your health care provider.   Document Released: 07/03/2007 Document Revised: 07/16/2011 Document Reviewed: 03/30/2011 Elsevier Interactive Patient Education 2016 [employer]  Cellulitis Cellulitis is an infection of the skin and the tissue under the skin. The infected area is usually red and tender. This happens most often in the arms and lower legs. HOME CARE   Take your antibiotic medicine as told. Finish the medicine even if you start to feel better.  Keep the infected arm or leg raised (elevated).  Put a warm cloth on the area up to 4 times per day.  Only take medicines as told by your doctor.  Keep all doctor visits as told. GET HELP IF:  You see red streaks on  the skin coming from the infected area.  Your red area gets bigger or turns a dark color.  Your bone or joint under the infected area is painful after the skin heals.  Your infection comes back in the same area or different area.  You have a puffy (swollen) bump in the infected area.  You have new symptoms.  You have a fever. GET HELP RIGHT AWAY IF:   You feel very sleepy.  You throw up (vomit) or have watery poop (diarrhea).  You feel sick and have muscle aches and pains.   This information is not intended to replace advice given to you by your health care provider. Make sure you discuss any questions you have with your health care provider.   Document Released: 07/03/2007 Document Revised: 10/05/2014 Document Reviewed: 04/01/2011 Elsevier Interactive Patient Education 2016 [employer]  Remove the packing in 2 days. Keep the wound clean, dry, and covered. Apply warm compresses to promote healing. Take the new antibiotic as directed. Follow-up with the urgent care or your provider as needed.

## 2022-08-15 ENCOUNTER — Other Ambulatory Visit
Admission: RE | Admit: 2022-08-15 | Discharge: 2022-08-15 | Disposition: A | Discharge status: Home / Self Care | Payer: PRIVATE HEALTH INSURANCE | Source: Ambulatory Visit | Attending: Cardiovascular Disease | Admitting: Cardiovascular Disease

## 2022-08-15 ENCOUNTER — Ambulatory Visit: Payer: PRIVATE HEALTH INSURANCE | Attending: Cardiovascular Disease | Admitting: Cardiovascular Disease

## 2022-08-15 ENCOUNTER — Encounter: Payer: Self-pay | Admitting: Cardiovascular Disease

## 2022-08-15 VITALS — BP 138/102 | HR 64 | Ht 74.0 in | Wt 272.1 lb

## 2022-08-15 DIAGNOSIS — R002 Palpitations: Secondary | ICD-10-CM | POA: Insufficient documentation

## 2022-08-15 DIAGNOSIS — R0602 Shortness of breath: Secondary | ICD-10-CM

## 2022-08-15 LAB — CBC
HCT: 46.1 % (ref 39.0–52.0)
Hemoglobin: 14.4 g/dL (ref 13.0–17.0)
MCH: 25.4 pg — ABNORMAL LOW (ref 26.0–34.0)
MCHC: 31.2 g/dL (ref 30.0–36.0)
MCV: 81.4 fL (ref 80.0–100.0)
Platelets: 228 10*3/uL (ref 150–400)
RBC: 5.66 MIL/uL (ref 4.22–5.81)
RDW: 14.6 % (ref 11.5–15.5)
WBC: 4.9 10*3/uL (ref 4.0–10.5)
nRBC: 0 % (ref 0.0–0.2)

## 2022-08-15 LAB — COMPREHENSIVE METABOLIC PANEL
ALT: 24 U/L (ref 0–44)
AST: 24 U/L (ref 15–41)
Albumin: 4.7 g/dL (ref 3.5–5.0)
Alkaline Phosphatase: 66 U/L (ref 38–126)
Anion gap: 7 (ref 5–15)
BUN: 16 mg/dL (ref 6–20)
CO2: 24 mmol/L (ref 22–32)
Calcium: 8.8 mg/dL — ABNORMAL LOW (ref 8.9–10.3)
Chloride: 105 mmol/L (ref 98–111)
Creatinine, Ser: 0.78 mg/dL (ref 0.61–1.24)
GFR, Estimated: >60 mL/min (ref >60)
Glucose, Bld: 103 mg/dL — ABNORMAL HIGH (ref 70–99)
Potassium: 4 mmol/L (ref 3.5–5.1)
Sodium: 136 mmol/L (ref 135–145)
Total Bilirubin: 0.6 mg/dL (ref 0.3–1.2)
Total Protein: 7.6 g/dL (ref 6.5–8.1)

## 2022-08-15 LAB — TSH: TSH: 2.614 u[IU]/mL (ref 0.350–4.500)

## 2022-08-15 NOTE — Progress Notes (Signed)
Cardiology Office Note   Date:  08/15/2022   ID:  Bryce Tran, DOB [birth year], MRN [id number]  PCP:  Donata Duff, DO  Cardiologist:   Lorine Bears, MD   Chief Complaint  Patient presents with   New Patient (Initial Visit)    Palpitations/sob. Meds reviewed verbally with pt.      History of Present Illness: Bryce Tran is a 37 y.o. male who presents for evaluation of palpitations and shortness of breath.  He has no prior cardiac history.  He was diagnosed with iron deficiency anemia in 2017 that required transfusion.  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  Genetic testing for hereditary hemorrhagic telangiectasia was negative.  He received iron transfusion with improvement.  He continues to take an iron supplement. He is here for evaluation of shortness of breath that was noticeable with hiking.  As he continued to exercise, his symptoms improved and he continues to workout on a regular basis.  He has occasional palpitations described as skipped beats.  No dizziness, syncope or presyncope.  He does not smoke.  No excessive alcohol use.  No family history of premature coronary artery disease.  He had COVID-19 infection in 2020 but his symptoms were not severe at that time.  He works as a Education officer, environmental.    Past Medical History:  Diagnosis Date   Anemia    IRON DEFICIENCY-LAST RECEIVED IRON INFUSION ON 04-2015-LAST LABS 04-2015 [facility] 13.8   Complication of anesthesia    PONV (postoperative nausea and vomiting)    PT VOMITED X1 AFTER COLONOSCOPY/EGD    Past Surgical History:  Procedure Laterality Date   ACHILLES TENDON SURGERY Right 06/01/2015   Procedure: ACHILLES TENDON REPAIR;  Surgeon: Christena Flake, MD;  Location: [facility] ORS;  Service: Orthopedics;  Laterality: Right;   COLONOSCOPY WITH ESOPHAGOGASTRODUODENOSCOPY (EGD)  2013   TONSILLECTOMY     VASECTOMY  05-2015     Current Outpatient Medications  Medication Sig Dispense Refill   ibuprofen (ADVIL,MOTRIN)  800 MG tablet Take 800 mg by mouth every 8 (eight) hours.     loratadine (CLARITIN) 10 MG tablet Take 10 mg by mouth daily.     No current facility-administered medications for this visit.    Allergies:   Iron dextran and Sulfonamide derivatives    Social History:  The patient  reports that he has never smoked. He does not have any smokeless tobacco history on file. He reports that he does not drink alcohol and does not use drugs.   Family History:  The patient's family history includes Stroke in his mother.    ROS:  Please see the history of present illness.   Otherwise, review of systems are positive for none.   All other systems are reviewed and negative.    PHYSICAL EXAM: VS:  BP (!) 138/102 (BP Location: Right Arm, Patient Position: Sitting, Cuff Size: Large)   Pulse 64   Ht 6\' 2"  (1.88 m)   Wt 272 lb 2 oz (123.4 kg)   SpO2 98%   BMI 34.94 kg/m  , BMI Body mass index is 34.94 kg/m. GEN: Well nourished, well developed, in no acute distress  HEENT: normal  Neck: no JVD, carotid bruits, or masses Cardiac: RRR; no murmurs, rubs, or gallops,no edema  Respiratory:  clear to auscultation bilaterally, normal work of breathing GI: soft, nontender, nondistended, + BS MS: no deformity or atrophy  Skin: warm and dry, no rash Neuro:  Strength and sensation are intact Psych:  euthymic mood, full affect   EKG:  EKG is ordered today. The ekg ordered today demonstrates : Sinus rhythm with 1st degree A-V block No previous ECGs available    Recent Labs: No results found for requested labs within last 365 days.    Lipid Panel No results found for: "CHOL", "TRIG", "HDL", "CHOLHDL", "VLDL", "LDLCALC", "LDLDIRECT"    Wt Readings from Last 3 Encounters:  08/15/22 272 lb 2 oz (123.4 kg)  06/17/15 238 lb (108 kg)  06/01/15 243 lb (110.2 kg)          08/15/2022    8:16 AM  PAD Screen  Previous PAD dx? No  Previous surgical procedure? Yes  Pain with walking? No  Feet/toe  relief with dangling? No  Painful, non-healing ulcers? No  Extremities discolored? No      ASSESSMENT AND PLAN:  1.  Shortness of breath: Overall mild and seems to be improving as he continues to exercise.  No associated chest pain.  Given previous history of iron deficiency anemia and no recent labs, I requested routine labs on him including CBC, CMP and TSH. His cardiac physical exam is unremarkable and baseline EKG is with no significant abnormalities.  Thus, I feel that the yield of an echocardiogram was overall low.  He will let me know if his symptoms worsen and we can request if needed.  Suspicion for underlying ischemic heart disease is very low overall.  2.  Palpitations: Likely due to premature beats.    Disposition:   FU with me as needed  Signed,  Lorine Bears, MD  08/15/2022 8:38 AM    [facility]

## 2022-08-15 NOTE — Patient Instructions (Signed)
Medication Instructions:  No changes *If you need a refill on your cardiac medications before your next appointment, please call your pharmacy*   Lab Work: Your provider would like for you to have following labs drawn: CBC, CMET and TSH.   Please go to the [facility] entrance and check in at the front desk.  You do not need an appointment.  They are open from 7am-6 pm.   If you have labs (blood work) drawn today and your tests are completely normal, you will receive your results only by: MyChart Message (if you have MyChart) OR A paper copy in the mail If you have any lab test that is abnormal or we need to change your treatment, we will call you to review the results.   Testing/Procedures: None ordered   Follow-Up: At [facility], you and your health needs are our priority.  As part of our continuing mission to provide you with exceptional heart care, we have created designated Provider Care Teams.  These Care Teams include your primary Cardiologist (physician) and Advanced Practice Providers (APPs -  Physician Assistants and Nurse Practitioners) who all work together to provide you with the care you need, when you need it.  We recommend signing up for the patient portal called "MyChart".  Sign up information is provided on this After Visit Summary.  MyChart is used to connect with patients for Virtual Visits (Telemedicine).  Patients are able to view lab/test results, encounter notes, upcoming appointments, etc.  Non-urgent messages can be sent to your provider as well.   To learn more about what you can do with MyChart, go to [email or web address].    Your next appointment:   Follow up as needed
